# Patient Record
Sex: Female | Born: 2000 | Race: Black or African American | Hispanic: No | Marital: Single
Health system: Midwestern US, Community
[De-identification: ages and names within clinical notes are randomized; demographics above are authoritative.]

---

## 2019-01-26 ENCOUNTER — Emergency Department: Admit: 2019-01-26 | Payer: BLUE CROSS/BLUE SHIELD | Primary: Family Medicine

## 2019-01-26 ENCOUNTER — Inpatient Hospital Stay
Admit: 2019-01-26 | Discharge: 2019-01-27 | Disposition: A | Discharge status: Home / Self Care | Payer: BLUE CROSS/BLUE SHIELD | Attending: Emergency Medicine

## 2019-01-26 DIAGNOSIS — N760 Acute vaginitis: Secondary | ICD-10-CM

## 2019-01-26 LAB — CT/GC DNA
CHLAMYDIA TRACHOMATIS DNA, CTDNA: NOT DETECTED
CHLAMYDIA TRACHOMATIS DNA: NOT DETECTED
NEISSERIA GONORRHOEAE DNA, GCDNA: DETECTED — AB
NEISSERIA GONORRHOEAE DNA: DETECTED — AB

## 2019-01-26 LAB — POC URINE MICROSCOPIC

## 2019-01-26 LAB — METABOLIC PANEL, BASIC
Anion gap: 6 mmol/L (ref 5–15)
BUN: 16 mg/dl (ref 7–25)
CO2: 26 mEq/L (ref 21–32)
Calcium: 9.1 mg/dl (ref 8.5–10.1)
Chloride: 104 mEq/L (ref 98–107)
Creatinine: 0.8 mg/dl (ref 0.6–1.3)
GFR est AA: >60
GFR est non-AA: >60
Glucose: 89 mg/dl (ref 74–106)
Potassium: 3.9 mEq/L (ref 3.5–5.1)
Sodium: 136 mEq/L (ref 136–145)

## 2019-01-26 LAB — URINALYSIS W/ RFLX MICROSCOPIC
Bilirubin, Urine: NEGATIVE
Bilirubin: NEGATIVE
Glucose, Ur: NEGATIVE mg/dl
Glucose: NEGATIVE mg/dl
Ketone: NEGATIVE mg/dl
Ketones, Urine: NEGATIVE mg/dl
Nitrite, Urine: NEGATIVE
Nitrites: NEGATIVE
Protein, UA: NEGATIVE mg/dl
Protein: NEGATIVE mg/dl
Specific Gravity, UA: 1.025 (ref 1.005–1.030)
Specific gravity: 1.025 (ref 1.005–1.030)
Urobilinogen, UA, POCT: 0.2 mg/dl (ref 0.0–1.0)
Urobilinogen: 0.2 mg/dl (ref 0.0–1.0)
pH (UA): 5.5 (ref 5.0–9.0)
pH, UA: 5.5 (ref 5.0–9.0)

## 2019-01-26 LAB — CBC WITH AUTOMATED DIFF
BASOPHILS: 0.4 % (ref 0–3)
EOSINOPHILS: 2.3 % (ref 0–5)
HCT: 37.1 % (ref 37.0–50.0)
HGB: 12 gm/dl — ABNORMAL LOW (ref 13.0–17.2)
IMMATURE GRANULOCYTES: 0.3 % (ref 0.0–3.0)
LYMPHOCYTES: 16.5 % — ABNORMAL LOW (ref 28–48)
MCH: 25 pg — ABNORMAL LOW (ref 25.4–34.6)
MCHC: 32.3 gm/dl (ref 30.0–36.0)
MCV: 77.3 fL — ABNORMAL LOW (ref 80.0–98.0)
MONOCYTES: 8.3 % (ref 1–13)
MPV: 10.4 fL — ABNORMAL HIGH (ref 6.0–10.0)
NEUTROPHILS: 72.2 % — ABNORMAL HIGH (ref 34–64)
NRBC: 0 (ref 0–0)
PLATELET: 451 10*3/uL — ABNORMAL HIGH (ref 140–450)
RBC: 4.8 M/uL (ref 3.60–5.20)
RDW-SD: 36.5 (ref 36.4–46.3)
WBC: 12.8 10*3/uL — ABNORMAL HIGH (ref 4.0–11.0)

## 2019-01-26 LAB — WET PREP

## 2019-01-26 LAB — HCG URINE, QL
HCG urine, QL: NEGATIVE
Pregnancy Test(Urn): NEGATIVE

## 2019-01-26 LAB — CBC WITH AUTO DIFFERENTIAL
Basophils %: 0.4 % (ref 0–3)
Eosinophils %: 2.3 % (ref 0–5)
Hematocrit: 37.1 % (ref 37.0–50.0)
Hemoglobin: 12 gm/dl — ABNORMAL LOW (ref 13.0–17.2)
Immature Granulocytes: 0.3 % (ref 0.0–3.0)
Lymphocytes %: 16.5 % — ABNORMAL LOW (ref 28–48)
MCH: 25 pg — ABNORMAL LOW (ref 25.4–34.6)
MCHC: 32.3 gm/dl (ref 30.0–36.0)
MCV: 77.3 fL — ABNORMAL LOW (ref 80.0–98.0)
MPV: 10.4 fL — ABNORMAL HIGH (ref 6.0–10.0)
Monocytes %: 8.3 % (ref 1–13)
Neutrophils %: 72.2 % — ABNORMAL HIGH (ref 34–64)
Nucleated RBCs: 0 (ref 0–0)
Platelets: 451 10*3/uL — ABNORMAL HIGH (ref 140–450)
RBC: 4.8 M/uL (ref 3.60–5.20)
RDW-SD: 36.5 (ref 36.4–46.3)
WBC: 12.8 10*3/uL — ABNORMAL HIGH (ref 4.0–11.0)

## 2019-01-26 LAB — BASIC METABOLIC PANEL
Anion Gap: 6 mmol/L (ref 5–15)
BUN: 16 mg/dl (ref 7–25)
CO2: 26 mEq/L (ref 21–32)
Calcium: 9.1 mg/dl (ref 8.5–10.1)
Chloride: 104 mEq/L (ref 98–107)
Creatinine: 0.8 mg/dl (ref 0.6–1.3)
EGFR IF NonAfrican American: >60
GFR African American: >60
Glucose: 89 mg/dl (ref 74–106)
Potassium: 3.9 mEq/L (ref 3.5–5.1)
Sodium: 136 mEq/L (ref 136–145)

## 2019-01-26 MED ORDER — SODIUM CHLORIDE 0.9 % IJ SYRG
Freq: Once | INTRAMUSCULAR | Status: AC
Start: 2019-01-26 — End: 2019-01-26
  Administered 2019-01-26: 22:00:00 via INTRAVENOUS

## 2019-01-26 MED ORDER — ACETAMINOPHEN 325 MG TABLET
325 mg | ORAL | Status: AC
Start: 2019-01-26 — End: 2019-01-26
  Administered 2019-01-26: 21:00:00 via ORAL

## 2019-01-26 MED ORDER — IOPAMIDOL 76 % IV SOLN
37076 mg iodine /mL (76 %) | Freq: Once | INTRAVENOUS | Status: AC
Start: 2019-01-26 — End: 2019-01-26
  Administered 2019-01-26: via INTRAVENOUS

## 2019-01-26 MED FILL — ACETAMINOPHEN 325 MG TABLET: 325 mg | ORAL | Qty: 3

## 2019-01-26 MED FILL — ISOVUE-370  76 % INTRAVENOUS SOLUTION: 370 mg iodine /mL (76 %) | INTRAVENOUS | Qty: 80

## 2019-01-26 NOTE — ED Notes (Signed)
Report from John, RN.

## 2019-01-26 NOTE — ED Notes (Signed)
8:34 PM  01/26/19     Discharge instructions given to patient (name) with verbalization of understanding. Patient accompanied by family member.  Patient discharged with the following prescriptions, Flagyl and . Patient discharged to home. (destination).      Hassel Neth Purdom

## 2019-01-26 NOTE — ED Notes (Signed)
Patient c/c of abd cramping that started a week ago.  She was seen at [facility] for simliar complaints 3 days ago.  She feels like she is constipated. It feels like cramping pressure. The pain started around the time of her cycle.

## 2019-01-26 NOTE — ED Provider Notes (Signed)
ED Provider Notes by Rejeana BrockKlein, Jonavon Trieu G, NP at 01/26/19 1606                Author: Rejeana BrockKlein, Aquinnah Devin G, NP  Service: EMERGENCY  Author Type: Nurse Practitioner       Filed: 01/26/19 1951  Date of Service: 01/26/19 1606  Status: Attested Addendum          Editor: Rejeana BrockKlein, Hattie Pine G, NP (Nurse Practitioner)       Related Notes: Original Note by Rejeana BrockKlein, Leisl Spurrier G, NP (Nurse Practitioner) filed at 01/26/19  1951          Cosigner: Gwenyth AllegraNelson, Timothy S, MD at 01/26/19 2050          Attestation signed by Gwenyth AllegraNelson, Timothy S, MD at 01/26/19 2050          I interviewed and examined the patient. I discussed the patient with the advanced practice provider, Rejeana BrockLouellen G Mahesh Sizemore, NP, and I agree with the evaluation and  plan as documented here.      On my exam patient states that her pain is almost completely gone its down to 1 out of 10.  She is not been running fevers.  She reports for 5 days of episodic pain and points to the right lower quadrant just above the inguinal ligament.  She reports  being seen in [facility] department and "they did not tell me anything".  She has no rebound no guarding and really no significant abdominal tenderness at time of my exam.  Her BMI is 58.9.  There was no reported significant tenderness on pelvic  exam.  She denies running fevers.  She is afebrile here.  Urine is trace leukocytes negative nitrites.  Trace blood.  Pregnancy screen negative.  Wet prep showed clue cells.  No yeast no trichomonas.  WBC minimally elevated 12.8.  Hemoglobin hematocrit  of 12 and 37.  Platelets are normal.  Differential shows 72% neutrophils.  We ordered a CT scan of the abdomen pelvis with IV contrast.  While we are waiting for the CT, the patient's GC/chlamydia DNA probe came back positive for gonorrhea.  CT scan shows  marked inflammation within the pelvis more so on the right side.  There appears to be an involving tubular structure findings concerning for salpingitis, phlegmon or abscess or  pyosalpinx.  The patient does not have any systemic illness or fever.  Only  minimal leukocytosis.  At this point I think we can treat with IM Rocephin, oral doxycycline, and oral metronidazole.  I do not think she warrants admission for IV antibiotics but do think that she needs to follow-up closely with OB/GYN.  This is been  stressed to the patient.  She is asked us not to tell her father about the diagnosis.  I agree with the documentation as detailed below.                                       [facility]   Emergency Department Treatment Report          Patient: Margaret Meza  Age: 18 y.o.  Sex: female          Date of Birth: [birth year]  Admit Date: 01/26/2019  PCP: Sol Blazingther, Phys, MD     MRN: [id number]   CSN: [id number]   Gwenyth AllegraNELSON, TIMOTHY S, MD         Room: [id number]/[id number]  Time Dictated: 4:06 PM  Rejeana Brock, NP           Chief Complaint      Chief Complaint       Patient presents with        ?  Abdominal Pain        ?  Constipation              History of Present Illness     18 y.o. female  presents to the emergency department today for right lower quadrant abdominal pain.  Patient reports that she has had abdominal pain for approximately 1 week.  She reports that she went to the emergency department in [place] in which they did an x-ray  and a urinalysis which did not show any abnormalities.  The patient reports that she began taking mag citrate on Sunday and developed loose stools up until Monday.  She now states he has had normal bowel movement since yesterday and also had one today.   She reports that her pain comes and goes to the right lower quadrant.  Currently her pain is 3/10.  She has taken Tylenol and Motrin in the past but it reports that has not alleviated her pain.  She denies any nausea, vomiting, diarrhea, fevers, genital  discharge, or dysuria.  She reports that her last menstrual period ended on Saturday but cannot recall when it first started.  She did not have any  complications with her most recent menstrual period.        Review of Systems     Review of Systems    Constitutional: Negative for chills and fever.    HENT: Negative for congestion.     Eyes: Negative for pain.    Respiratory: Negative for cough.     Cardiovascular: Negative for chest pain.    Gastrointestinal: Positive for abdominal pain. Negative for nausea and vomiting.    Genitourinary: Negative for dysuria.    Musculoskeletal: Negative for myalgias.    Skin: Negative for rash.    Neurological: Negative for dizziness.            Past Medical/Surgical History     History reviewed. No pertinent past medical history.   History reviewed. No pertinent surgical history.        Social History          Social History          Socioeconomic History         ?  Marital status:  SINGLE              Spouse name:  Not on file         ?  Number of children:  Not on file     ?  Years of education:  Not on file     ?  Highest education level:  Not on file       Occupational History        ?  Not on file       Social Needs         ?  Financial resource strain:  Not on file        ?  Food insecurity:              Worry:  Not on file         Inability:  Not on file        ?  Transportation needs:  Medical:  Not on file         Non-medical:  Not on file       Tobacco Use         ?  Smoking status:  Never Smoker     ?  Smokeless tobacco:  Never Used       Substance and Sexual Activity         ?  Alcohol use:  Not on file     ?  Drug use:  Not Currently     ?  Sexual activity:  Not on file       Lifestyle        ?  Physical activity:              Days per week:  Not on file         Minutes per session:  Not on file         ?  Stress:  Not on file       Relationships        ?  Social connections:              Talks on phone:  Not on file         Gets together:  Not on file         Attends religious service:  Not on file         Active member of club or organization:  Not on file         Attends meetings of clubs or  organizations:  Not on file         Relationship status:  Not on file        ?  Intimate partner violence:              Fear of current or ex partner:  Not on file         Emotionally abused:  Not on file         Physically abused:  Not on file         Forced sexual activity:  Not on file        Other Topics  Concern        ?  Not on file       Social History Narrative        ?  Not on file             Family History     History reviewed. No pertinent family history.        Current Medications          None             Allergies     No Known Allergies        Physical Exam        Visit Vitals      BP  153/84     Pulse  96     Temp  99.3 ??F (37.4 ??C)     Resp  18     Ht  5\' 5"  (1.651 m)     Wt  (!) 160.6 kg (354 lb)     LMP  01/16/2019     SpO2  100%        BMI  58.91 kg/m??        Physical Exam   Exam conducted with a chaperone present.   Constitutional:        General: She is awake. She  is not in acute distress.     Appearance: She is obese. She is not toxic-appearing.   HENT:       Head: Normocephalic.   Neck:       Musculoskeletal: Normal range of motion and neck supple.   Cardiovascular :       Rate and Rhythm: Normal rate and regular rhythm.      Heart sounds: Normal heart sounds.    Pulmonary:       Effort: Pulmonary effort is normal. No respiratory distress.      Breath sounds: Normal breath sounds.   Chest:       Chest wall: No tenderness.   Abdominal :      General: Bowel sounds are normal. There is no distension.      Palpations: Abdomen is soft.      Tenderness: There is abdominal tenderness  in the right lower quadrant. There is no guarding or rebound.    Genitourinary :      Exam position: Supine.      Cervix: Discharge present. No cervical motion tenderness, lesion or erythema.      Adnexa:          Right: No tenderness.          Left: No tenderness.        Comments: There is a fishy odor during exam.  There is moderate amount of yellowish thin discharge noted.  Musculoskeletal: Normal  range of motion.     Skin:      General: Skin is warm and dry.      Capillary Refill: Capillary refill takes less than 2 seconds.      Findings: No erythema or rash.   Neurological:       General: No focal deficit present.      Mental Status: She is alert and oriented to person, place, and time.      GCS: GCS eye subscore is 4 . GCS verbal subscore is 5. GCS motor subscore is 6 .   Psychiatric :         Mood and Affect: Mood and affect normal.         Speech: Speech normal.         Behavior: Behavior normal. Behavior is cooperative.         Cognition and Memory: Memory normal.         Judgment: Judgment normal.               Impression and Management Plan     This is a 18 year old patient who presents to the emergency department today for a chief complaint of right lower quadrant abdominal pain that is been occurring for approximately  a week.  Currently the patient is not showing any acute signs symptoms of distress and does not appear toxic that would warrant further work-up, however because the patient has been complaining of right lower quadrant abdominal pain for approximately  1 week, a CT of the abdomen will be obtained to evaluate for any abnormalities is pertaining to her abdominal pain.  A chaperoned pelvic exam was completed with the findings noted above.  Lab work will be obtained to evaluate for any metabolic abnormalities  and infectious process.      DDX includes but is not limited to: UTI vs STI vs appendicitis vs ovarian cyst vs ovarian torsion        Diagnostic Studies     Lab:      Recent Results (  from the past 12 hour(s))     URINALYSIS W/ RFLX MICROSCOPIC          Collection Time: 01/26/19  3:09 PM         Result  Value  Ref Range            Color  YELLOW  YELLOW,STRAW         Appearance  CLOUDY (A)  CLEAR         Glucose  NEGATIVE  NEGATIVE,Negative mg/dl       Bilirubin  NEGATIVE  NEGATIVE,Negative         Ketone  NEGATIVE  NEGATIVE,Negative mg/dl       Specific gravity  1.025  1.005 - 1.030         Blood   TRACE (A)  NEGATIVE,Negative         pH (UA)  5.5  5.0 - 9.0         Protein  NEGATIVE  NEGATIVE,Negative mg/dl       Urobilinogen  0.2  0.0 - 1.0 mg/dl       Nitrites  NEGATIVE  NEGATIVE,Negative         Leukocyte Esterase  TRACE (A)  NEGATIVE,Negative         POC URINE MICROSCOPIC          Collection Time: 01/26/19  3:09 PM         Result  Value  Ref Range            CRYSTALS, AMORPHOUS PHOSPHATE  3+  /HPF       HCG URINE, QL          Collection Time: 01/26/19  3:09 PM         Result  Value  Ref Range            HCG urine, QL  NEGATIVE  NEGATIVE         WET PREP          Collection Time: 01/26/19  4:25 PM         Result  Value  Ref Range            Wet prep                  Clue Cells Seen   No Yeast or motile Trichomonas seen          CT/GC DNA          Collection Time: 01/26/19  4:25 PM         Result  Value  Ref Range            CHLAMYDIA TRACHOMATIS DNA  NOT DETECTED  NOT DETECTED         NEISSERIA GONORRHOEAE DNA  DETECTED (A)  NOT DETECTED         METABOLIC PANEL, BASIC          Collection Time: 01/26/19  5:00 PM         Result  Value  Ref Range            Sodium  136  136 - 145 mEq/L       Potassium  3.9  3.5 - 5.1 mEq/L       Chloride  104  98 - 107 mEq/L       CO2  26  21 - 32 mEq/L       Glucose  89  74 - 106 mg/dl  BUN  16  7 - 25 mg/dl       Creatinine  0.8  0.6 - 1.3 mg/dl       GFR est AA  >10.1          GFR est non-AA  >60          Calcium  9.1  8.5 - 10.1 mg/dl       Anion gap  6  5 - 15 mmol/L       CBC WITH AUTOMATED DIFF          Collection Time: 01/26/19  5:00 PM         Result  Value  Ref Range            WBC  12.8 (H)  4.0 - 11.0 1000/mm3       RBC  4.80  3.60 - 5.20 M/uL       HGB  12.0 (L)  13.0 - 17.2 gm/dl       HCT  75.1  02.5 - 50.0 %       MCV  77.3 (L)  80.0 - 98.0 fL       MCH  25.0 (L)  25.4 - 34.6 pg       MCHC  32.3  30.0 - 36.0 gm/dl       PLATELET  852 (H)  140 - 450 1000/mm3       MPV  10.4 (H)  6.0 - 10.0 fL       RDW-SD  36.5  36.4 - 46.3         NRBC  0  0 - 0          IMMATURE GRANULOCYTES  0.3  0.0 - 3.0 %       NEUTROPHILS  72.2 (H)  34 - 64 %       LYMPHOCYTES  16.5 (L)  28 - 48 %       MONOCYTES  8.3  1 - 13 %       EOSINOPHILS  2.3  0 - 5 %            BASOPHILS  0.4  0 - 3 %           Imaging:     Ct Abd Pelv W Cont      Result Date: 01/26/2019   INDICATION: Right lower quadrant pain COMPARISON: None available. TECHNIQUE: CT of the abdomen and pelvis WITH intravenous contrast. DICOM format image data is available to non-affiliated external healthcare facilities or entities on a secure, media free,  reciprocally searchable basis with patient authorization for 12 months following the date of the study. Findings: Liver, gallbladder, pancreas, spleen, adrenal glands and kidneys grossly unremarkable. Hyperdensity within the collecting system of the kidneys,  likely reflecting early contrast excretion. Small and large bowel grossly unremarkable. A normal appendix is identified. A few scattered retroperitoneal lymph nodes, which are not pathologically enlarged by size criteria, though increased in number. Extensive  inflammatory change is noted within the pelvis, predominantly surrounding a tubular structure within the right adnexa, which demonstrates heterogeneous enhancement. This structure appears to be separate, though situated just anterior to the right ovary.  This is concerning for infection within the right adnexa, perhaps the fallopian tube. Heterogeneous enhancement of the left ovary, which is also slightly prominent in size roughly measuring 4.6 x 3.9 cm. Visualized osseous structures grossly unremarkable.       IMPRESSION: 1. Marked inflammation within the  pelvis, more so on the right, involving a tubular structure. Findings concerning for salpingitis, phlegmon /abscess or pyosalpinx. Heterogeneous left ovary. Dedicated ultrasound is recommended.            Medical Decision Making/ED Course      The patient tested positive for gonorrhea.  All other lab work was otherwise  unremarkable.  White blood  cell count is within normal limits.  CT of the exam shows is inflammation within the pelvis on the right tubular structure which is concerning for salpingitis abscess or pyosalpinx.  However, the patient currently is not showing any systemic signs and  symptoms such as fever, hypotension, tachycardia that would be concerning for infection at this time.  Findings were discussed with the patient who verbalized understanding.  It was discussed with the patient that she should abstain from sexual activity  until she is retested again for cure.  The patient verbalized understanding and receiving IM Rocephin and doing oral antibiotics of Flagyl and doxycycline for 2 weeks.  I also recommended that she follow-up with the [facility] department for further  STI work-up.  I highly encouraged the patient to follow-up with her primary care doctor and also with an OB/GYN in regards to her condition.  Signs and symptoms on when to return to the emergency department were discussed.  Patient verbalized understanding  of all instructions and was amenable to discharge.        Final Diagnosis                 ICD-10-CM  ICD-9-CM          1.  Abdominal pain, right lower quadrant  R10.31  789.03     2.  BV (bacterial vaginosis)  N76.0  616.10           B96.89  041.9          3.  Gonorrhea  A54.9  098.0             Disposition     Discharge      Dalbert Mayotte, FNP-BC   January 26, 2019    4:06 PM       The patient was personally evaluated by myself and with Dr. Delton See, Lavone Neri, MD who agrees with the above assessment and plan.      Dragon medical dictation software was used for portions of this report. Unintended errors may occur.       My signature above authenticates this document and my orders, the final diagnosis(es), discharge prescription(s), and instructions in the Epic record.      If you have any questions please contact [phone].       Nursing notes have been reviewed by the  physician/advanced practice clinician.

## 2019-01-26 NOTE — ED Notes (Signed)
Started last week with intermittent LLQ abd pain  Denies any other symptoms

## 2019-01-26 NOTE — ED Notes (Signed)
Bedside shift change report given to Huntley Dec RN (oncoming nurse) by Alberteen Spindle (offgoing nurse). Report included the following information ED Summary.

## 2019-01-26 NOTE — ED Triage Notes (Signed)
Patient c/c of abd cramping that started a week ago.  She was seen at [facility] for simliar complaints 3 days ago.  She feels like she is constipated. It feels like cramping pressure. The pain started around the time of her cycle.

## 2019-01-26 NOTE — ED Notes (Signed)
Started last week with intermittent LLQ abd pain  Denies any other symptoms

## 2019-01-26 NOTE — ED Provider Notes (Addendum)
[facility] Care  Emergency Department Treatment Report    Patient: Margaret Meza Age: 18 y.o. Sex: female    Date of Birth: [birth year] Admit Date: 01/26/2019 PCP: Sol Blazing, MD   MRN: [id number]  CSN: [id number]  Gwenyth Allegra, MD   Room: [id number] Time Dictated: 4:06 PM Rejeana Brock, NP       Chief Complaint   Chief Complaint   Patient presents with   ??? Abdominal Pain   ??? Constipation        History of Present Illness   18 y.o. female presents to the emergency department today for right lower quadrant abdominal pain.  Patient reports that she has had abdominal pain for approximately 1 week.  She reports that she went to the emergency department in [place] in which they did an x-ray and a urinalysis which did not show any abnormalities.  The patient reports that she began taking mag citrate on Sunday and developed loose stools up until Monday.  She now states he has had normal bowel movement since yesterday and also had one today.  She reports that her pain comes and goes to the right lower quadrant.  Currently her pain is 3/10.  She has taken Tylenol and Motrin in the past but it reports that has not alleviated her pain.  She denies any nausea, vomiting, diarrhea, fevers, genital discharge, or dysuria.  She reports that her last menstrual period ended on Saturday but cannot recall when it first started.  She did not have any complications with her most recent menstrual period.    Review of Systems   Review of Systems   Constitutional: Negative for chills and fever.   HENT: Negative for congestion.    Eyes: Negative for pain.   Respiratory: Negative for cough.    Cardiovascular: Negative for chest pain.   Gastrointestinal: Positive for abdominal pain. Negative for nausea and vomiting.   Genitourinary: Negative for dysuria.   Musculoskeletal: Negative for myalgias.   Skin: Negative for rash.   Neurological: Negative for dizziness.       Past Medical/Surgical History    History reviewed. No pertinent past medical history.  History reviewed. No pertinent surgical history.    Social History     Social History     Socioeconomic History   ??? Marital status: SINGLE     Spouse name: Not on file   ??? Number of children: Not on file   ??? Years of education: Not on file   ??? Highest education level: Not on file   Occupational History   ??? Not on file   Social Needs   ??? Financial resource strain: Not on file   ??? Food insecurity:     Worry: Not on file     Inability: Not on file   ??? Transportation needs:     Medical: Not on file     Non-medical: Not on file   Tobacco Use   ??? Smoking status: Never Smoker   ??? Smokeless tobacco: Never Used   Substance and Sexual Activity   ??? Alcohol use: Not on file   ??? Drug use: Not Currently   ??? Sexual activity: Not on file   Lifestyle   ??? Physical activity:     Days per week: Not on file     Minutes per session: Not on file   ??? Stress: Not on file   Relationships   ??? Social connections:     Talks on phone:  Not on file     Gets together: Not on file     Attends religious service: Not on file     Active member of club or organization: Not on file     Attends meetings of clubs or organizations: Not on file     Relationship status: Not on file   ??? Intimate partner violence:     Fear of current or ex partner: Not on file     Emotionally abused: Not on file     Physically abused: Not on file     Forced sexual activity: Not on file   Other Topics Concern   ??? Not on file   Social History Narrative   ??? Not on file       Family History   History reviewed. No pertinent family history.    Current Medications     None       Allergies   No Known Allergies    Physical Exam     Visit Vitals  BP 153/84   Pulse 96   Temp 99.3 ??F (37.4 ??C)   Resp 18   Ht 5\' 5"  (1.651 m)   Wt (!) 160.6 kg (354 lb)   LMP 01/16/2019   SpO2 100%   BMI 58.91 kg/m??     Physical Exam  Exam conducted with a chaperone present.   Constitutional:       General: She is awake. She is not in acute distress.      Appearance: She is obese. She is not toxic-appearing.   HENT:      Head: Normocephalic.   Neck:      Musculoskeletal: Normal range of motion and neck supple.   Cardiovascular:      Rate and Rhythm: Normal rate and regular rhythm.      Heart sounds: Normal heart sounds.   Pulmonary:      Effort: Pulmonary effort is normal. No respiratory distress.      Breath sounds: Normal breath sounds.   Chest:      Chest wall: No tenderness.   Abdominal:      General: Bowel sounds are normal. There is no distension.      Palpations: Abdomen is soft.      Tenderness: There is abdominal tenderness in the right lower quadrant. There is no guarding or rebound.   Genitourinary:     Exam position: Supine.      Cervix: Discharge present. No cervical motion tenderness, lesion or erythema.      Adnexa:         Right: No tenderness.          Left: No tenderness.        Comments: There is a fishy odor during exam.  There is moderate amount of yellowish thin discharge noted.  Musculoskeletal: Normal range of motion.   Skin:     General: Skin is warm and dry.      Capillary Refill: Capillary refill takes less than 2 seconds.      Findings: No erythema or rash.   Neurological:      General: No focal deficit present.      Mental Status: She is alert and oriented to person, place, and time.      GCS: GCS eye subscore is 4. GCS verbal subscore is 5. GCS motor subscore is 6.   Psychiatric:         Mood and Affect: Mood and affect normal.  Speech: Speech normal.         Behavior: Behavior normal. Behavior is cooperative.         Cognition and Memory: Memory normal.         Judgment: Judgment normal.         Impression and Management Plan   This is a 18 year old patient who presents to the emergency department today for a chief complaint of right lower quadrant abdominal pain that is been occurring for approximately a week.  Currently the patient is not showing any acute signs symptoms of distress and does not appear toxic  that would warrant further work-up, however because the patient has been complaining of right lower quadrant abdominal pain for approximately 1 week, a CT of the abdomen will be obtained to evaluate for any abnormalities is pertaining to her abdominal pain.  A chaperoned pelvic exam was completed with the findings noted above.  Lab work will be obtained to evaluate for any metabolic abnormalities and infectious process.    DDX includes but is not limited to: UTI vs STI vs appendicitis vs ovarian cyst vs ovarian torsion    Diagnostic Studies   Lab:   Recent Results (from the past 12 hour(s))   URINALYSIS W/ RFLX MICROSCOPIC    Collection Time: 01/26/19  3:09 PM   Result Value Ref Range    Color YELLOW YELLOW,STRAW      Appearance CLOUDY (A) CLEAR      Glucose NEGATIVE NEGATIVE,Negative mg/dl    Bilirubin NEGATIVE NEGATIVE,Negative      Ketone NEGATIVE NEGATIVE,Negative mg/dl    Specific gravity 1.6101.025 1.005 - 1.030      Blood TRACE (A) NEGATIVE,Negative      pH (UA) 5.5 5.0 - 9.0      Protein NEGATIVE NEGATIVE,Negative mg/dl    Urobilinogen 0.2 0.0 - 1.0 mg/dl    Nitrites NEGATIVE NEGATIVE,Negative      Leukocyte Esterase TRACE (A) NEGATIVE,Negative     POC URINE MICROSCOPIC    Collection Time: 01/26/19  3:09 PM   Result Value Ref Range    CRYSTALS, AMORPHOUS PHOSPHATE 3+ /HPF   HCG URINE, QL    Collection Time: 01/26/19  3:09 PM   Result Value Ref Range    HCG urine, QL NEGATIVE NEGATIVE     WET PREP    Collection Time: 01/26/19  4:25 PM   Result Value Ref Range    Wet prep        Clue Cells Seen  No Yeast or motile Trichomonas seen     CT/GC DNA    Collection Time: 01/26/19  4:25 PM   Result Value Ref Range    CHLAMYDIA TRACHOMATIS DNA NOT DETECTED NOT DETECTED      NEISSERIA GONORRHOEAE DNA DETECTED (A) NOT DETECTED     METABOLIC PANEL, BASIC    Collection Time: 01/26/19  5:00 PM   Result Value Ref Range    Sodium 136 136 - 145 mEq/L    Potassium 3.9 3.5 - 5.1 mEq/L    Chloride 104 98 - 107 mEq/L     CO2 26 21 - 32 mEq/L    Glucose 89 74 - 106 mg/dl    BUN 16 7 - 25 mg/dl    Creatinine 0.8 0.6 - 1.3 mg/dl    GFR est AA >96.0>60.0      GFR est non-AA >60      Calcium 9.1 8.5 - 10.1 mg/dl    Anion gap 6 5 - 15 mmol/L  CBC WITH AUTOMATED DIFF    Collection Time: 01/26/19  5:00 PM   Result Value Ref Range    WBC 12.8 (H) 4.0 - 11.0 1000/mm3    RBC 4.80 3.60 - 5.20 M/uL    HGB 12.0 (L) 13.0 - 17.2 gm/dl    HCT 96.0 45.4 - 09.8 %    MCV 77.3 (L) 80.0 - 98.0 fL    MCH 25.0 (L) 25.4 - 34.6 pg    MCHC 32.3 30.0 - 36.0 gm/dl    PLATELET 119 (H) 147 - 450 1000/mm3    MPV 10.4 (H) 6.0 - 10.0 fL    RDW-SD 36.5 36.4 - 46.3      NRBC 0 0 - 0      IMMATURE GRANULOCYTES 0.3 0.0 - 3.0 %    NEUTROPHILS 72.2 (H) 34 - 64 %    LYMPHOCYTES 16.5 (L) 28 - 48 %    MONOCYTES 8.3 1 - 13 %    EOSINOPHILS 2.3 0 - 5 %    BASOPHILS 0.4 0 - 3 %       Imaging:    Ct Abd Pelv W Cont    Result Date: 01/26/2019  INDICATION: Right lower quadrant pain COMPARISON: None available. TECHNIQUE: CT of the abdomen and pelvis WITH intravenous contrast. DICOM format image data is available to non-affiliated external healthcare facilities or entities on a secure, media free, reciprocally searchable basis with patient authorization for 12 months following the date of the study. Findings: Liver, gallbladder, pancreas, spleen, adrenal glands and kidneys grossly unremarkable. Hyperdensity within the collecting system of the kidneys, likely reflecting early contrast excretion. Small and large bowel grossly unremarkable. A normal appendix is identified. A few scattered retroperitoneal lymph nodes, which are not pathologically enlarged by size criteria, though increased in number. Extensive inflammatory change is noted within the pelvis, predominantly surrounding a tubular structure within the right adnexa, which demonstrates heterogeneous enhancement. This structure appears to be separate, though situated just anterior to the right ovary. This is concerning for  infection within the right adnexa, perhaps the fallopian tube. Heterogeneous enhancement of the left ovary, which is also slightly prominent in size roughly measuring 4.6 x 3.9 cm. Visualized osseous structures grossly unremarkable.     IMPRESSION: 1. Marked inflammation within the pelvis, more so on the right, involving a tubular structure. Findings concerning for salpingitis, phlegmon /abscess or pyosalpinx. Heterogeneous left ovary. Dedicated ultrasound is recommended.       Medical Decision Making/ED Course    The patient tested positive for gonorrhea.  All other lab work was otherwise unremarkable.  White blood cell count is within normal limits.  CT of the exam shows is inflammation within the pelvis on the right tubular structure which is concerning for salpingitis abscess or pyosalpinx.  However, the patient currently is not showing any systemic signs and symptoms such as fever, hypotension, tachycardia that would be concerning for infection at this time.  Findings were discussed with the patient who verbalized understanding.  It was discussed with the patient that she should abstain from sexual activity until she is retested again for cure.  The patient verbalized understanding and receiving IM Rocephin and doing oral antibiotics of Flagyl and doxycycline for 2 weeks.  I also recommended that she follow-up with the [facility] department for further STI work-up.  I highly encouraged the patient to follow-up with her primary care doctor and also with an OB/GYN in regards to her condition.  Signs and symptoms on when  to return to the emergency department were discussed.  Patient verbalized understanding of all instructions and was amenable to discharge.    Final Diagnosis       ICD-10-CM ICD-9-CM   1. Abdominal pain, right lower quadrant R10.31 789.03   2. BV (bacterial vaginosis) N76.0 616.10    B96.89 041.9   3. Gonorrhea A54.9 098.0       Disposition   Discharge    Dalbert Mayotte, FNP-BC   January 26, 2019   4:06 PM     The patient was personally evaluated by myself and with Dr. Delton See, Lavone Neri, MD who agrees with the above assessment and plan.    Dragon medical dictation software was used for portions of this report. Unintended errors may occur.     My signature above authenticates this document and my orders, the final diagnosis(es), discharge prescription(s), and instructions in the Epic record.    If you have any questions please contact [phone].     Nursing notes have been reviewed by the physician/advanced practice clinician.

## 2019-01-26 NOTE — ED Notes (Signed)
Bedside shift change report given to Sara RN  (oncoming nurse) by Hayla Hinger RN (offgoing nurse). Report included the following information ED Summary.

## 2019-01-26 NOTE — ED Notes (Signed)
8:34 PM  01/26/19     Discharge instructions given to patient (name) with verbalization of understanding. Patient accompanied by family member.  Patient discharged with the following prescriptions, Flagyl and . Patient discharged to home. (destination).      Margaret Meza

## 2019-01-26 NOTE — ED Notes (Signed)
Report from John,RN.

## 2019-01-27 MED ORDER — DOXYCYCLINE HYCLATE 100 MG TAB
100 mg | ORAL_TABLET | Freq: Two times a day (BID) | ORAL | 0 refills | Status: AC
Start: 2019-01-27 — End: 2019-02-09

## 2019-01-27 MED ORDER — CEFTRIAXONE 250 MG SOLUTION FOR INJECTION
250 mg | Freq: Once | INTRAMUSCULAR | Status: AC
Start: 2019-01-27 — End: 2019-01-26
  Administered 2019-01-27: 01:00:00 via INTRAMUSCULAR

## 2019-01-27 MED ORDER — METRONIDAZOLE 500 MG TAB
500 mg | ORAL_TABLET | Freq: Two times a day (BID) | ORAL | 0 refills | Status: AC
Start: 2019-01-27 — End: 2019-02-09

## 2019-01-27 MED FILL — CEFTRIAXONE 250 MG SOLUTION FOR INJECTION: 250 mg | INTRAMUSCULAR | Qty: 250

## 2019-12-11 ENCOUNTER — Emergency Department (HOSPITAL_COMMUNITY): Payer: BC Managed Care – PPO

## 2019-12-11 ENCOUNTER — Other Ambulatory Visit: Payer: Self-pay

## 2019-12-11 ENCOUNTER — Encounter (HOSPITAL_COMMUNITY): Payer: Self-pay

## 2019-12-11 ENCOUNTER — Emergency Department (HOSPITAL_COMMUNITY)
Admission: EM | Admit: 2019-12-11 | Discharge: 2019-12-11 | Disposition: A | Discharge status: Home / Self Care | Payer: BC Managed Care – PPO | Attending: Emergency Medicine | Admitting: Emergency Medicine

## 2019-12-11 DIAGNOSIS — R0789 Other chest pain: Secondary | ICD-10-CM | POA: Diagnosis not present

## 2019-12-11 DIAGNOSIS — M25511 Pain in right shoulder: Secondary | ICD-10-CM | POA: Diagnosis present

## 2019-12-11 MED ORDER — NAPROXEN 500 MG PO TABS: 500.0000 mg | ORAL_TABLET | Freq: Two times a day (BID) | ORAL | 0 refills | Status: AC

## 2019-12-11 MED ORDER — METHOCARBAMOL 500 MG PO TABS: 500.0000 mg | ORAL_TABLET | Freq: Three times a day (TID) | ORAL | 0 refills | Status: AC | PRN

## 2019-12-11 NOTE — Discharge Instructions (Addendum)
You were seen in the ER today for right shoulder tingling/discomfort and right sided rib pain. Your xrays did not show any abnormalities. We suspect your symptoms may be due to muscular irritation. WE are sending you home with the following medicines:   - Naproxen is a nonsteroidal anti-inflammatory medication that will help with pain and swelling. Be sure to take this medication as prescribed with food, 1 pill every 12 hours,  It should be taken with food, as it can cause stomach upset, and more seriously, stomach bleeding. Do not take other nonsteroidal anti-inflammatory medications with this such as Advil, Motrin, Aleve, Mobic, Goodie Powder, or Motrin.    - Robaxin is the muscle relaxer I have prescribed, this is meant to help with muscle tightness. Be aware that this medication may make you drowsy therefore the first time you take this it should be at a time you are in an environment where you can rest. Do not drive or operate heavy machinery when taking this medication. Do not drink alcohol or take other sedating medications with this medicine such as narcotics or benzodiazepines.   You make take Tylenol per over the counter dosing with these medications.   We have prescribed you new medication(s) today. Discuss the medications prescribed today with your pharmacist as they can have adverse effects and interactions with your other medicines including over the counter and prescribed medications. Seek medical evaluation if you start to experience new or abnormal symptoms after taking one of these medicines, seek care immediately if you start to experience difficulty breathing, feeling of your throat closing, facial swelling, or rash as these could be indications of a more serious allergic reaction  Please follow up with primary care within 1 week.  Return to the ER for new or worsening symptoms including but not limited to increased pain, trouble breathing, coughing up blood, fever, increase tingling,  numbness, weakness, or any other concerns.

## 2019-12-11 NOTE — ED Provider Notes (Signed)
[facility]-EMERGENCY DEPT Provider Note   CSN: [id number] Arrival date & time: 12/11/19  1038     History Chief Complaint  Patient presents with  . Shoulder Pain  . rib cage pain    Teresa Parsons is a 18 y.o. female without significant past medical hx who presents to the ED with complaints of R sided rib/shoulder pain x 1 week. Patient states pain is primarily to the R lateral ribs, also having some discomfort/pressure/tingling to the R shoulder. She states sxs occur intermittently, specifically with heavy lifting/overhead motions of the RUE. Has had temporary relief with ibuprofen at times. She has no specific traumatic injury, but does a lot of heavy lifting as she works for an [employer] and is right hand dominant. Denies numbness, weakness, other areas of tingling, neck pain, dyspnea, chest pain, abdominal pain, nausea, vomiting,  leg pain/swelling, hemoptysis, recent surgery/trauma, recent long travel, hormone use, personal hx of cancer, or hx of DVT/PE.      HPI     History reviewed. No pertinent past medical history.  There are no problems to display for this patient.   History reviewed. No pertinent surgical history.   OB History   No obstetric history on file.     No family history on file.  Social History   Tobacco Use  . Smoking status: Not on file  Substance Use Topics  . Alcohol use: Not on file  . Drug use: Not on file    Home Medications Prior to Admission medications   Not on File    Allergies    Patient has no known allergies.  Review of Systems   Review of Systems  Constitutional: Negative for chills, diaphoresis and fever.  Respiratory: Negative for cough and shortness of breath.   Cardiovascular: Negative for chest pain, palpitations and leg swelling.  Gastrointestinal: Negative for abdominal pain, nausea and vomiting.  Musculoskeletal: Positive for arthralgias. Negative for back pain and neck pain.       +  for R lateral rib pain.   Neurological: Negative for weakness and numbness.       + for R shoulder tingling at times.     Physical Exam Updated Vital Signs BP (!) 167/98 (BP Location: Left Arm)   Pulse 94   Temp 98.7 F (37.1 C) (Oral)   Resp 16   Ht 5\' 5"  (1.651 m)   Wt (!) 168.3 kg   LMP 12/04/2019 (Approximate)   SpO2 100%   BMI 61.74 kg/m   Physical Exam Vitals and nursing note reviewed.  Constitutional:      General: She is not in acute distress.    Appearance: Normal appearance. She is well-developed. She is not ill-appearing or toxic-appearing.  HENT:     Head: Normocephalic and atraumatic.  Eyes:     General:        Right eye: No discharge.        Left eye: No discharge.     Conjunctiva/sclera: Conjunctivae normal.  Neck:     Comments: No midline tenderness.  Cardiovascular:     Rate and Rhythm: Normal rate and regular rhythm.     Pulses:          Radial pulses are 2+ on the right side and 2+ on the left side.  Pulmonary:     Effort: Pulmonary effort is normal. No respiratory distress.     Breath sounds: Normal breath sounds. No wheezing, rhonchi or rales.  Chest:  Chest wall: Tenderness (right lateral chest wall which reproduces patient's pain) present.  Abdominal:     General: There is no distension.     Palpations: Abdomen is soft.     Tenderness: There is no abdominal tenderness.  Musculoskeletal:     Cervical back: Normal range of motion and neck supple.     Comments: Upper extremities: No obvious deformity, appreciable swelling, edema, erythema, ecchymosis, warmth, or open wounds. Patient has intact AROM throughout. No point/focal bony tenderness.   Skin:    General: Skin is warm and dry.     Capillary Refill: Capillary refill takes less than 2 seconds.     Findings: No rash.  Neurological:     Mental Status: She is alert.     Comments: Alert. Clear speech. Sensation grossly intact to bilateral upper extremities. 5/5 symmetric grip strength &  strength with wrist/elbow/shoulder flexion/extension. Ambulatory.   Psychiatric:        Mood and Affect: Mood normal.        Behavior: Behavior normal.    ED Results / Procedures / Treatments   Labs (all labs ordered are listed, but only abnormal results are displayed) Labs Reviewed - No data to display  EKG None  Radiology DG Ribs Unilateral W/Chest Right  Result Date: 12/11/2019 CLINICAL DATA:  Pt states he has been lifting heavy boxes with her job, noticed some pain to her superior right shoulder x 1 week ago, as well as pain to right lateral/anterior lower ribs during inspiration. Denies any known trauma or previous injuries. EXAM: RIGHT RIBS AND CHEST - 3+ VIEW COMPARISON:  None. FINDINGS: No fracture or other bone lesions are seen involving the ribs. There is no evidence of pneumothorax or pleural effusion. Both lungs are clear. Heart size and mediastinal contours are within normal limits. IMPRESSION: Negative. Electronically Signed   By: Amie Portlandavid  Ormond M.D.   On: 12/11/2019 11:46   DG Shoulder Right  Result Date: 12/11/2019 CLINICAL DATA:  Pt states he has been lifting heavy boxes with her job, noticed some pain to her superior right shoulder x 1 week ago, as well as pain to right lateral/anterior lower ribs during inspiration. Denies any known trauma or previous injuries. EXAM: RIGHT SHOULDER - 2+ VIEW COMPARISON:  None. FINDINGS: There is no evidence of fracture or dislocation. There is no evidence of arthropathy or other focal bone abnormality. Soft tissues are unremarkable. IMPRESSION: Negative. Electronically Signed   By: Amie Portlandavid  Ormond M.D.   On: 12/11/2019 11:46    Procedures Procedures (including critical care time)  Medications Ordered in ED Medications - No data to display  ED Course/MDM:  I have reviewed the triage vital signs and the nursing notes.  Pertinent labs & imaging results that were available during my care of the patient were reviewed by me and considered  in my medical decision making (see chart for details).   Patient presents to the ED with complaints of R shoulder/rib cage pain and some intermittent tingling sensation localized to the R shoulder with heavy lifting. Patient is nontoxic appearing, in no apparent distress, vitals WNL with the exception of elevated BP- doubt HTN emergency. No rashes/erythema/warm or ROM limitation, not consistent with shingles, septic joint, or cellulitis. Xrays w/o fracture/dislocation, infiltrate or PTX. Patient is NVI distally without focal neuro deficits. PERC negative, doubt PE. Overall suspect muscular etiology. Tx with naproxen & robaxin with short term lifting restrictions. PCP follow up. I discussed results, treatment plan, need for follow-up, and return precautions  with the patient. Provided opportunity for questions, patient confirmed understanding and is in agreement with plan.    Final Clinical Impression(s) / ED Diagnoses Final diagnoses:  Right-sided chest wall pain    Rx / DC Orders ED Discharge Orders         Ordered    naproxen (NAPROSYN) 500 MG tablet  2 times daily     12/11/19 1201    methocarbamol (ROBAXIN) 500 MG tablet  Every 8 hours PRN     12/11/19 1201           Dajanique Robley, [place], PA-C 12/11/19 1202    Davonna Belling, MD 12/11/19 1457

## 2019-12-11 NOTE — ED Triage Notes (Signed)
Patient c/o right rib cage area pain and right shoulder pain and states,"It's my muscles." x 7 days. 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

## 2019-12-16 NOTE — ED Notes (Signed)
Patient needed excuse note printed again

## 2020-05-21 IMAGING — CR DG RIBS W/ CHEST 3+V*R*
5 series · 5 of 5 positions shown · non-contrast
Comparison: None.

CLINICAL DATA: Pt states he has been lifting heavy boxes with her
job, noticed some pain to her superior right shoulder x 1 week ago,
as well as pain to right lateral/anterior lower ribs during
inspiration. Denies any known trauma or previous injuries.

EXAM:
RIGHT RIBS AND CHEST - 3+ VIEW

[w chest pa]
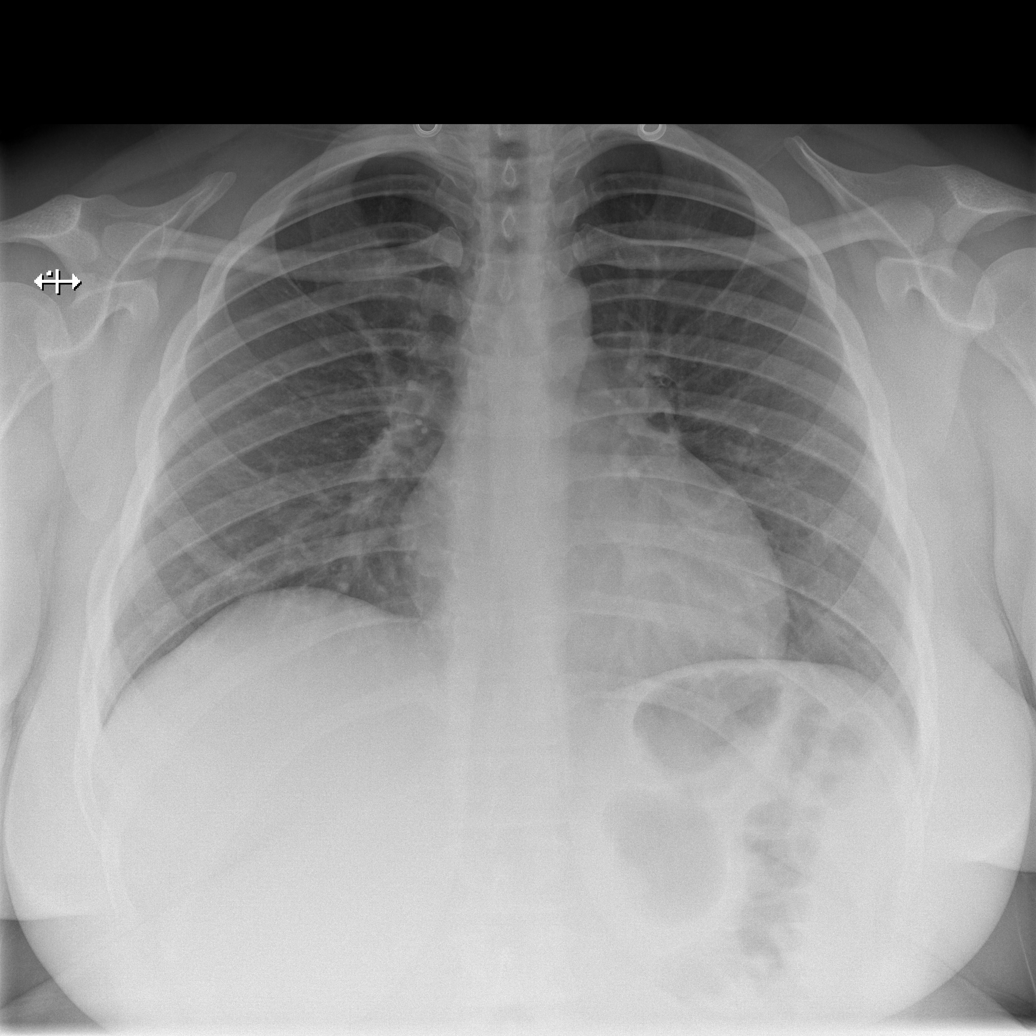

[w ribs ap upper right]
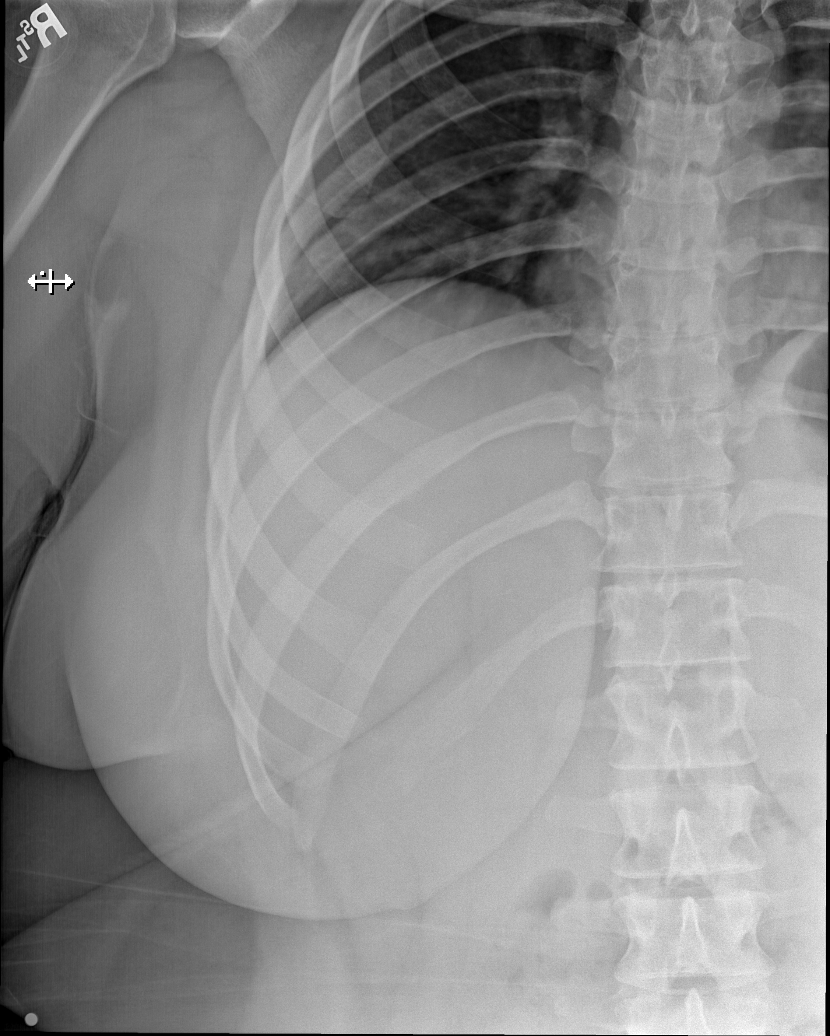

[w ribs ap lower right]
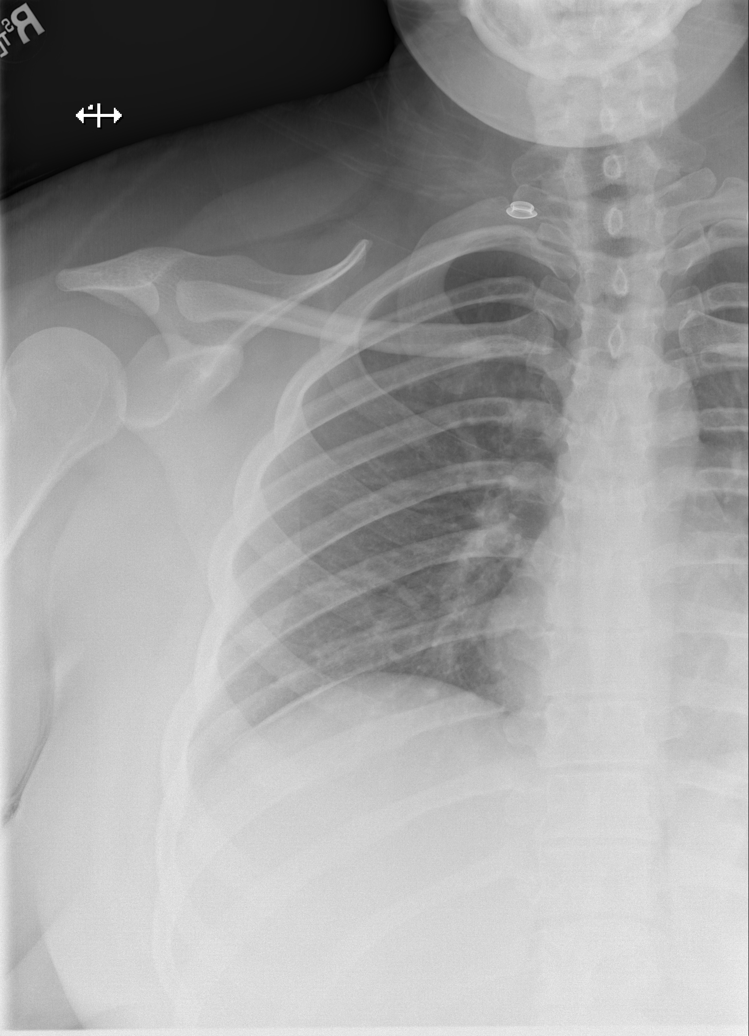

[w ribs obl right (1 of 2)]
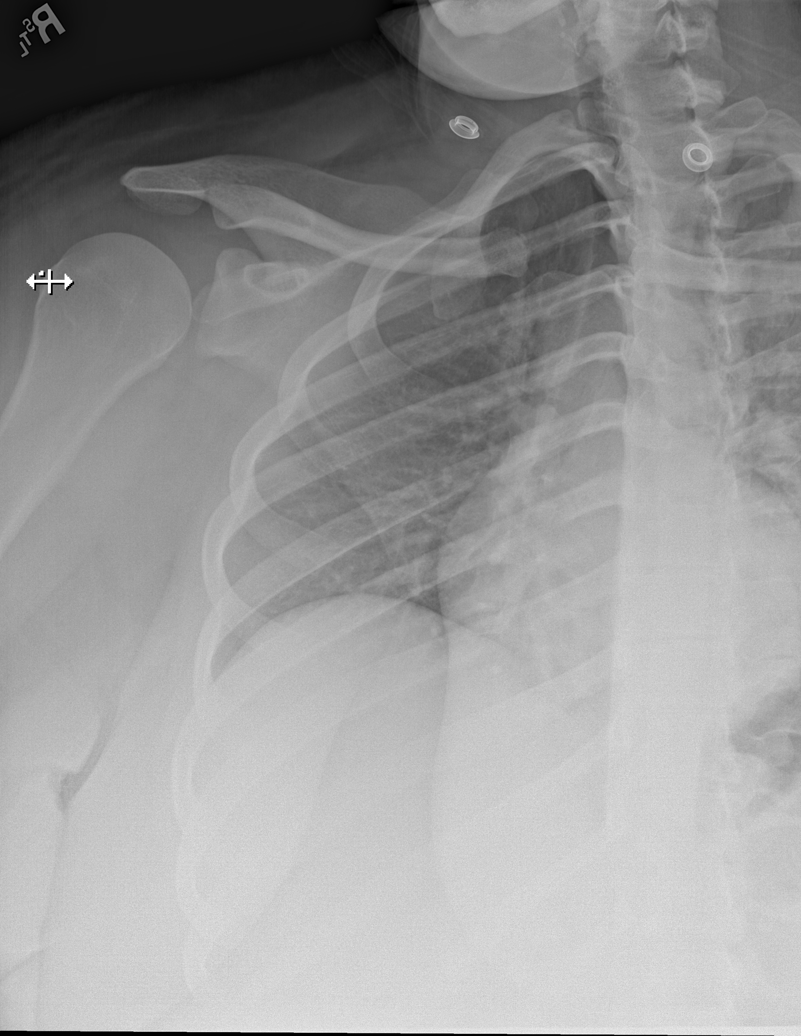

[w ribs obl right (2 of 2)]
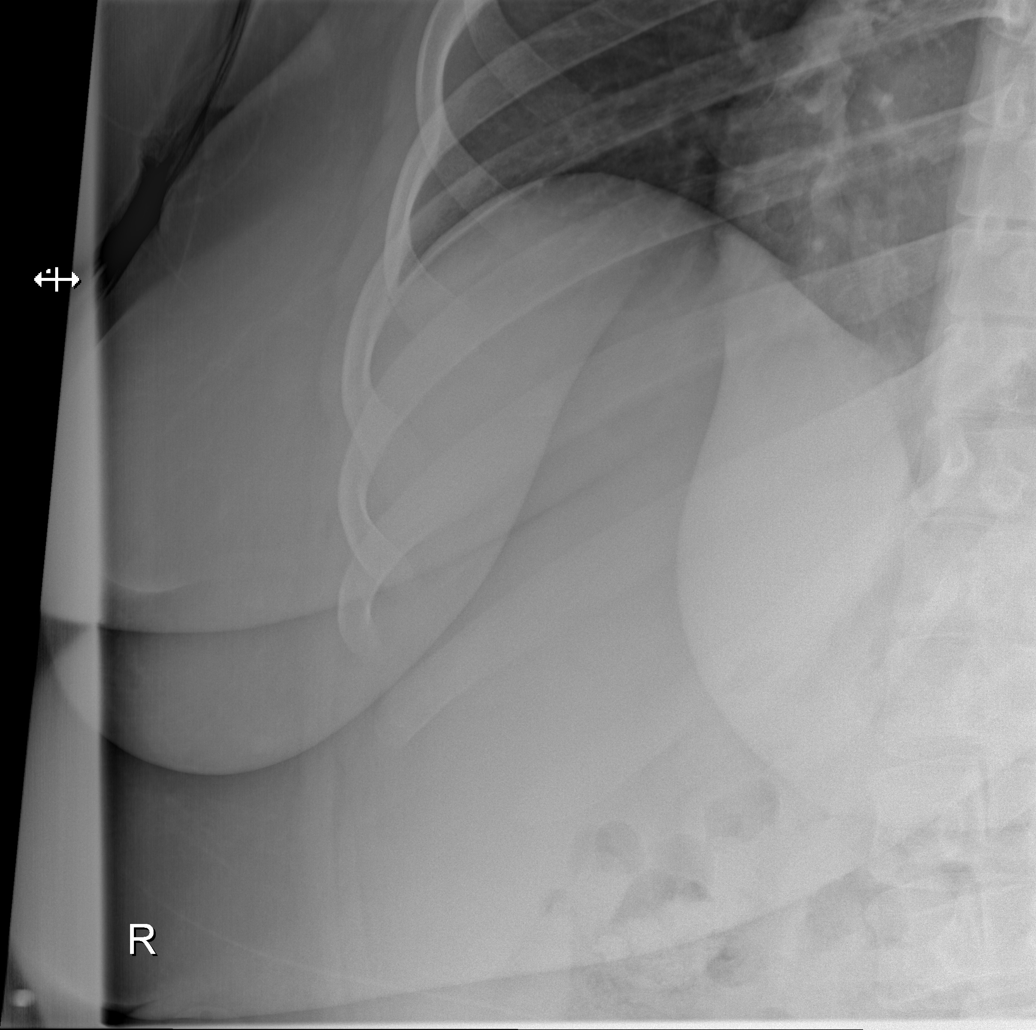

[5 of 5 positions shown; findings below may reference images not displayed]

FINDINGS: No fracture or other bone lesions are seen involving the ribs. There
is no evidence of pneumothorax or pleural effusion. Both lungs are
clear. Heart size and mediastinal contours are within normal limits.
IMPRESSION: Negative.

## 2020-05-21 IMAGING — CR DG SHOULDER 2+V*R*
3 series · 3 of 3 positions shown · non-contrast
Comparison: None.

CLINICAL DATA: Pt states he has been lifting heavy boxes with her
job, noticed some pain to her superior right shoulder x 1 week ago,
as well as pain to right lateral/anterior lower ribs during
inspiration. Denies any known trauma or previous injuries.

EXAM:
RIGHT SHOULDER - 2+ VIEW

[w shoulder external right]
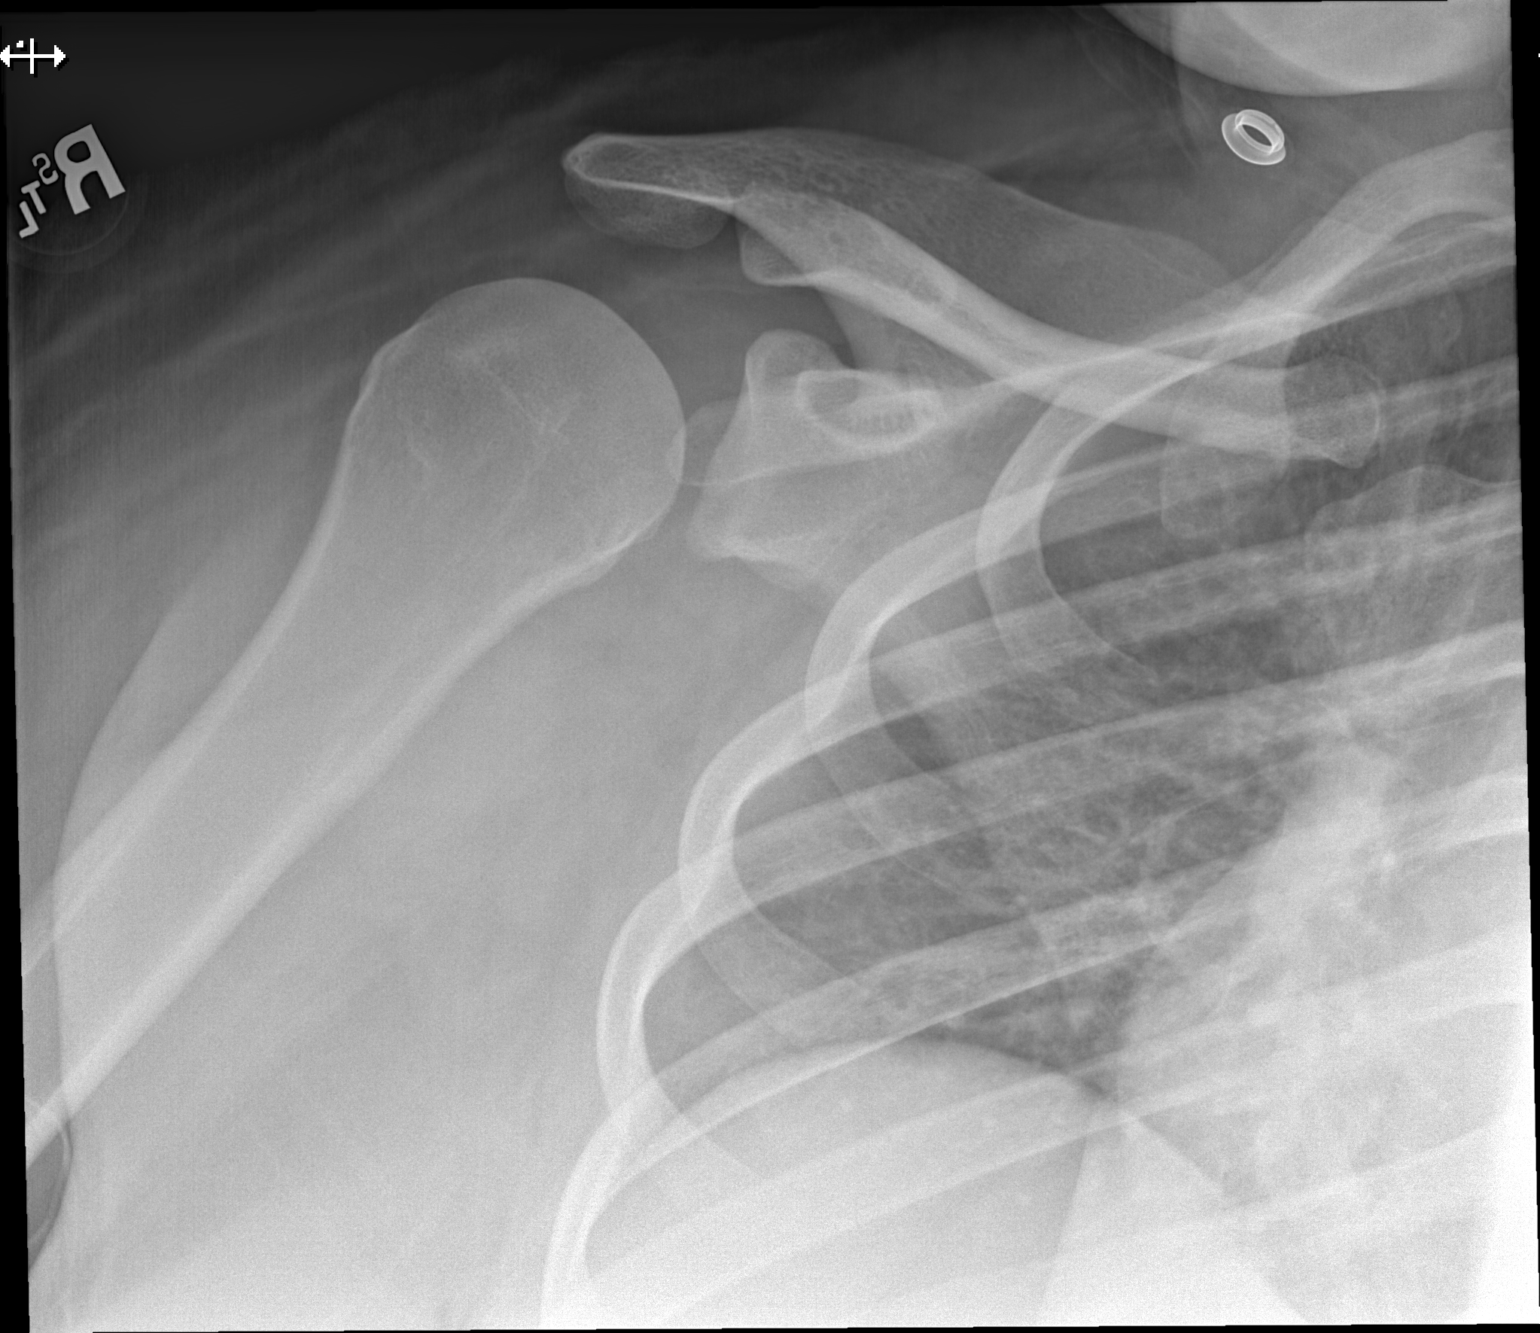

[w shoulder y-view right]
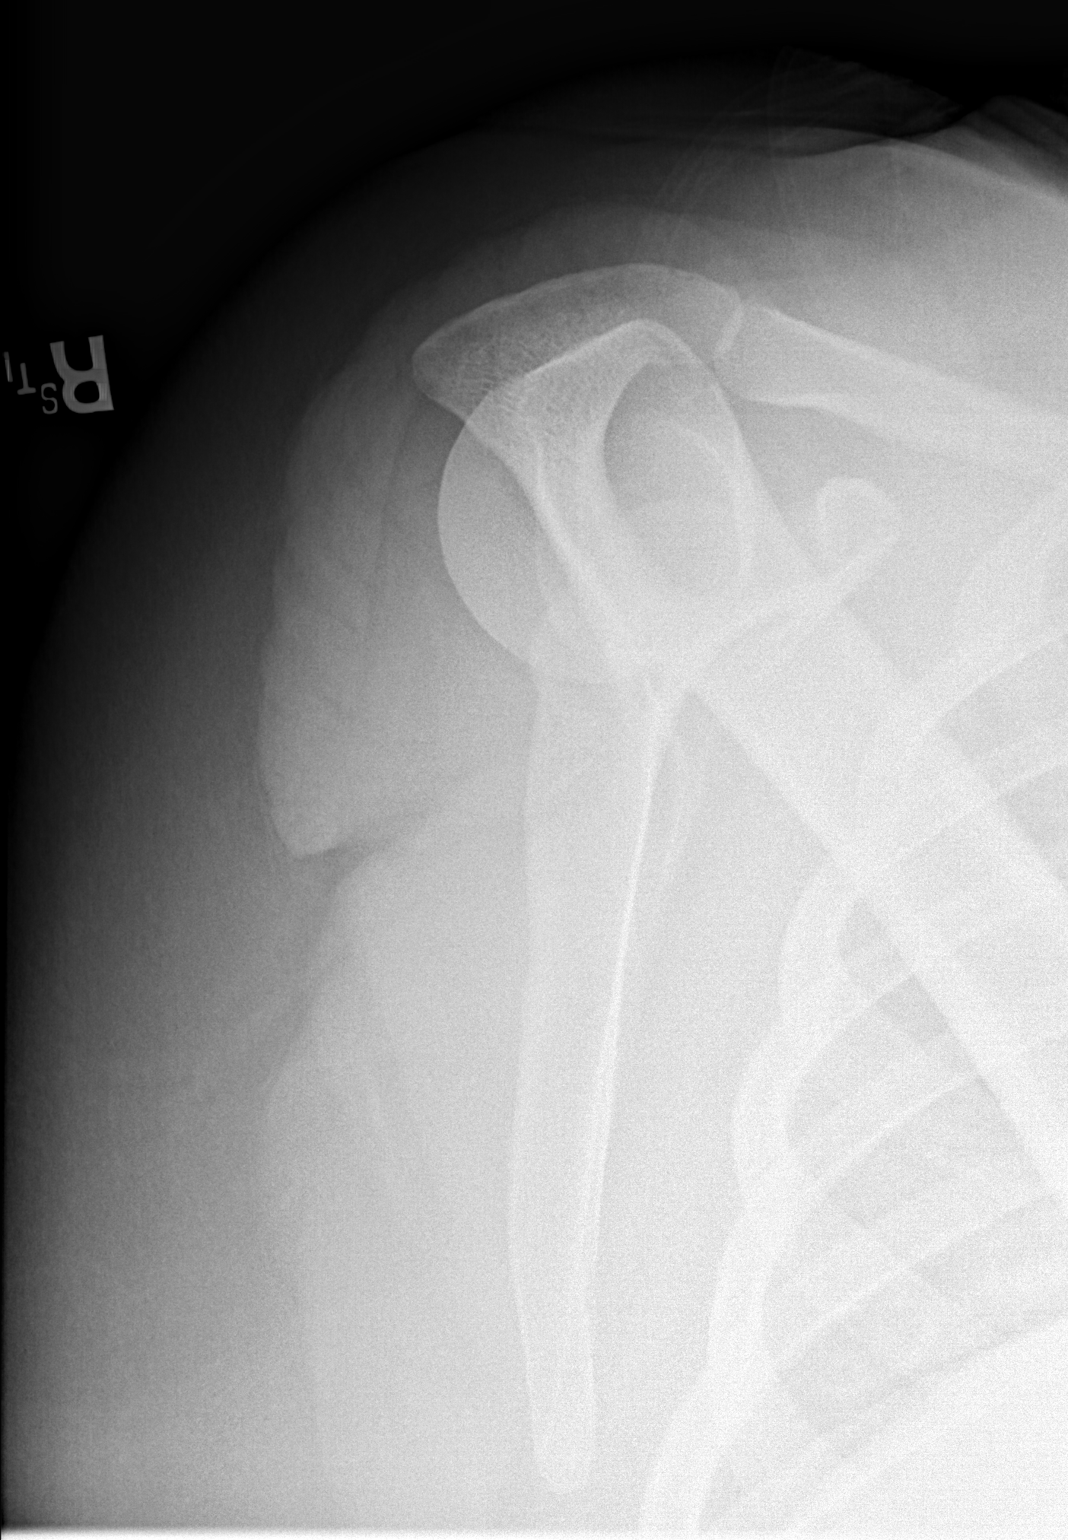

[x shoulder axillary right]
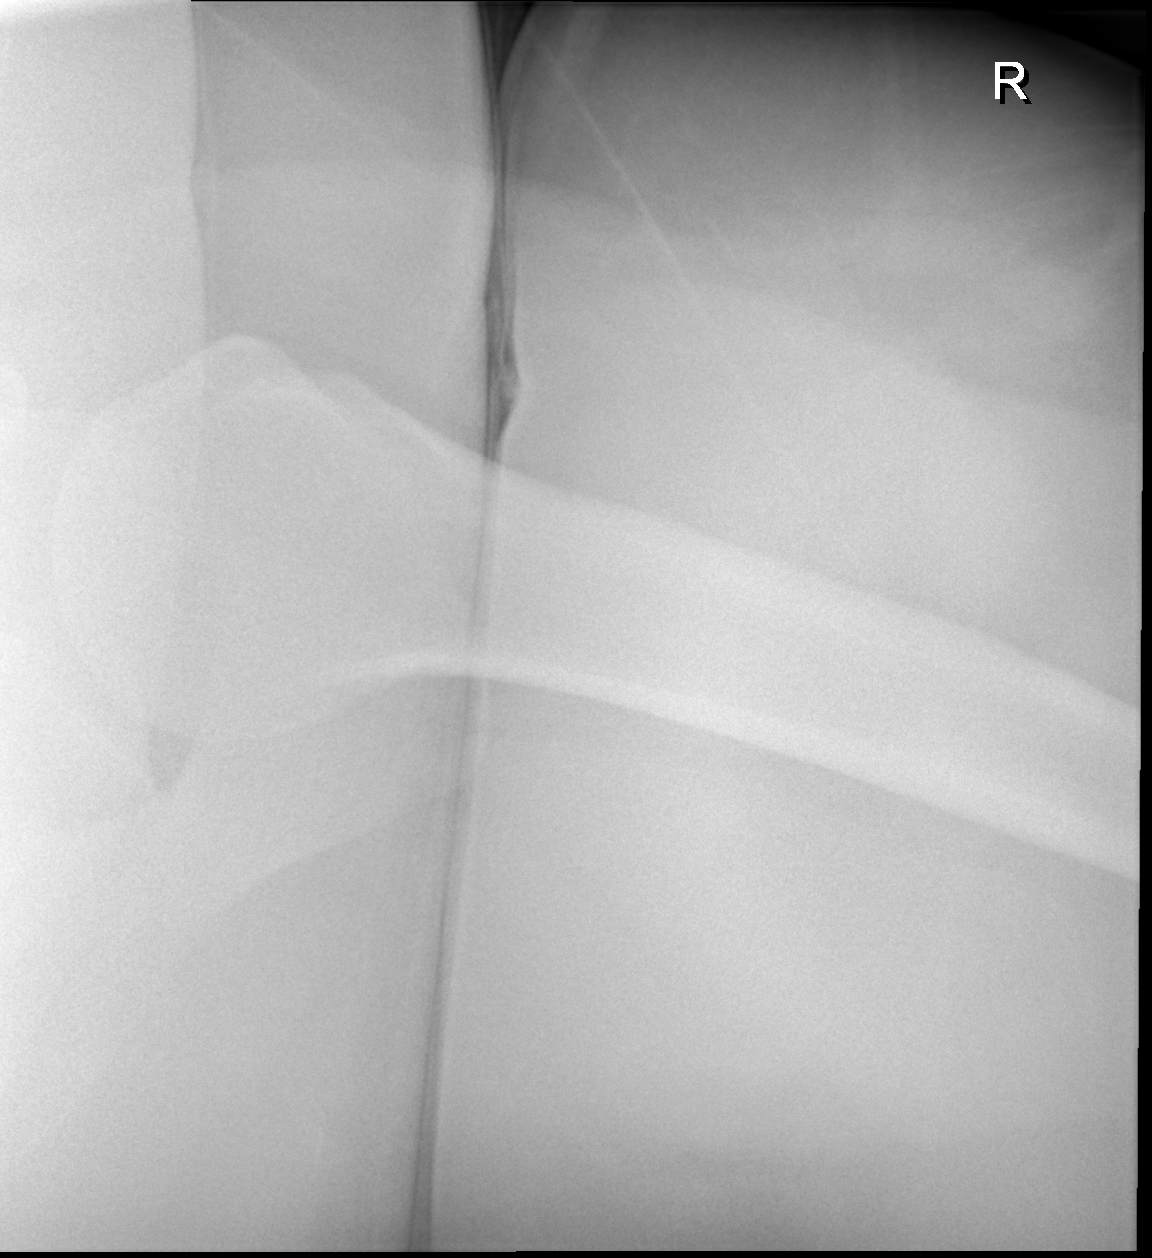

[3 of 3 positions shown; findings below may reference images not displayed]

FINDINGS: There is no evidence of fracture or dislocation. There is no
evidence of arthropathy or other focal bone abnormality. Soft
tissues are unremarkable.
IMPRESSION: Negative.

## 2021-01-11 ENCOUNTER — Emergency Department (HOSPITAL_BASED_OUTPATIENT_CLINIC_OR_DEPARTMENT_OTHER)
Admission: EM | Admit: 2021-01-11 | Discharge: 2021-01-11 | Disposition: A | Discharge status: Home / Self Care | Payer: BC Managed Care – PPO | Attending: Emergency Medicine | Admitting: Emergency Medicine

## 2021-01-11 ENCOUNTER — Other Ambulatory Visit: Payer: Self-pay

## 2021-01-11 ENCOUNTER — Encounter (HOSPITAL_BASED_OUTPATIENT_CLINIC_OR_DEPARTMENT_OTHER): Payer: Self-pay | Admitting: *Deleted

## 2021-01-11 DIAGNOSIS — H1012 Acute atopic conjunctivitis, left eye: Secondary | ICD-10-CM | POA: Diagnosis not present

## 2021-01-11 DIAGNOSIS — H5789 Other specified disorders of eye and adnexa: Secondary | ICD-10-CM | POA: Diagnosis present

## 2021-01-11 MED ORDER — TETRACAINE HCL 0.5 % OP SOLN
1.0000 [drp] | Freq: Once | OPHTHALMIC | Status: AC
  Administered 2021-01-11: 1 [drp] via OPHTHALMIC
  Filled 2021-01-11: qty 4

## 2021-01-11 MED ORDER — FLUORESCEIN SODIUM 1 MG OP STRP
1.0000 | ORAL_STRIP | Freq: Once | OPHTHALMIC | Status: AC
  Administered 2021-01-11: 1 via OPHTHALMIC
  Filled 2021-01-11: qty 1

## 2021-01-11 MED ORDER — NAPHAZOLINE-PHENIRAMINE 0.025-0.3 % OP SOLN: 1.0000 [drp] | Freq: Four times a day (QID) | OPHTHALMIC | 0 refills | Status: AC | PRN

## 2021-01-11 NOTE — ED Notes (Signed)
2 days ago began having redness in left eye, had light sensitivity, been using a cool rag which has provided relief. Had some burning sensation intermittently. Denies any vision issues, states "I can see fine" , left eye watery, tearing up at times as well.

## 2021-01-11 NOTE — ED Triage Notes (Signed)
Left eye irritation x 2 days.

## 2021-01-11 NOTE — Discharge Instructions (Signed)
Use eyedrops as directed. Follow-up with eye specialist listed below. Return to the ER if you start to experience pain with moving your eye, swelling around the eye, fever, pus draining from the eye

## 2021-01-11 NOTE — ED Provider Notes (Signed)
[facility] EMERGENCY DEPARTMENT Provider Note   CSN: [id number] Arrival date & time: 01/11/21  1239     History Chief Complaint  Patient presents with  . Eye Problem    Teresa Parsons is a 20 y.o. female who presents to ED with 2-day history of left eye irritation.  States that she woke up with irritation and clear drainage from her left eye.  States that she has been applying a cool compress on it which has significantly improved the symptoms.  States that she wanted to "still be checked out."  She does not wear contact or corrective lenses and denies any vision changes.  No foreign body sensation or trauma to the area.  Denies any pain with EOMs, proptosis, purulent drainage or fever.  HPI     History reviewed. No pertinent past medical history.  There are no problems to display for this patient.   History reviewed. No pertinent surgical history.   OB History   No obstetric history on file.     Family History  Problem Relation Age of Onset  . Diabetes Mother     Social History   Tobacco Use  . Smoking status: Never Smoker  . Smokeless tobacco: Never Used  Vaping Use  . Vaping Use: Never used  Substance Use Topics  . Alcohol use: Yes    Comment: socially  . Drug use: Yes    Types: Marijuana    Comment: august 2021    Home Medications Prior to Admission medications   Medication Sig Start Date End Date Taking? Authorizing Provider  naphazoline-pheniramine (NAPHCON-A) 0.025-0.3 % ophthalmic solution Place 1 drop into the left eye 4 (four) times daily as needed for eye irritation. 01/11/21  Yes Edgard Debord, PA-C  methocarbamol (ROBAXIN) 500 MG tablet Take 1 tablet (500 mg total) by mouth every 8 (eight) hours as needed. 12/11/19   Petrucelli, Samantha R, PA-C  naproxen (NAPROSYN) 500 MG tablet Take 1 tablet (500 mg total) by mouth 2 (two) times daily. 12/11/19   Petrucelli, Pleas Koch, PA-C    Allergies    Patient has no known allergies.  Review  of Systems   Review of Systems  Constitutional: Negative for fever.  HENT: Negative for facial swelling.   Eyes: Positive for discharge, redness and itching. Negative for photophobia, pain and visual disturbance.    Physical Exam Updated Vital Signs BP (!) 142/94 (BP Location: Right Arm)   Pulse 98   Temp 98.6 F (37 C) (Oral)   Resp 18   Ht 5\' 5"  (1.651 m)   LMP 12/11/2020   SpO2 99%   BMI 61.74 kg/m   Physical Exam Vitals and nursing note reviewed.  Constitutional:      General: She is not in acute distress.    Appearance: She is well-developed and well-nourished. She is not diaphoretic.  HENT:     Head: Normocephalic and atraumatic.  Eyes:     General: No scleral icterus.       Right eye: No foreign body or discharge.        Left eye: Discharge (Clear) present.No foreign body.     Extraocular Movements: EOM normal.     Conjunctiva/sclera: Conjunctivae normal.     Right eye: No exudate.    Left eye: No exudate.    Pupils: Pupils are equal, round, and reactive to light.     Comments: Left eye with injected conjunctiva, no eyelid swelling or erythema or tenderness to palpation.  Mild clear tearful  drainage noted.  No foreign bodies noted.  No pain with EOMs.  No chemosis, proptosis, or consensual photophobia. Fluorescein stain with no corneal abrasions, foreign bodies, dendritic lesions, ulcerations, negative Sidel sign.  Pulmonary:     Effort: Pulmonary effort is normal. No respiratory distress.  Musculoskeletal:     Cervical back: Normal range of motion.  Skin:    Findings: No rash.  Neurological:     Mental Status: She is alert.  Psychiatric:        Mood and Affect: Mood and affect normal.     ED Results / Procedures / Treatments   Labs (all labs ordered are listed, but only abnormal results are displayed) Labs Reviewed - No data to display  EKG None  Radiology No results found.  Procedures Procedures (including critical care time)  Medications  Ordered in ED Medications  fluorescein ophthalmic strip 1 strip (1 strip Left Eye Given 01/11/21 1604)  tetracaine (PONTOCAINE) 0.5 % ophthalmic solution 1 drop (1 drop Left Eye Given 01/11/21 1603)    ED Course  I have reviewed the triage vital signs and the nursing notes.  Pertinent labs & imaging results that were available during my care of the patient were reviewed by me and considered in my medical decision making (see chart for details).    MDM Rules/Calculators/A&P                          20 year old female presenting to the ED for left eye irritation, redness and clear drainage.  Symptoms began 2 days ago but have significantly improved with cool compresses and over-the-counter eyedrops.  Denies any blurry vision or changes to vision.  Visual examination by me of the left eye with some clear drainage but otherwise unremarkable.  EOMs are intact, no pain with EOMs, proptosis noted.  Fluorescein exam without any corneal abrasions or other abnormalities.  Suspect allergic conjunctivitis.  Will treat with Naphcon-A drops and ophthalmology follow-up.  No evidence of orbital cellulitis, corneal abrasion, or infectious conjunctivitis or other emergent cause.  Patient is agreeable to the plan.  Return precautions given.  Patient is hemodynamically stable, in NAD, and able to ambulate in the ED. Evaluation does not show pathology that would require ongoing emergent intervention or inpatient treatment. I explained the diagnosis to the patient. Pain has been managed and has no complaints prior to discharge. Patient is comfortable with above plan and is stable for discharge at this time. All questions were answered prior to disposition. Strict return precautions for returning to the ED were discussed. Encouraged follow up with PCP.   An After Visit Summary was printed and given to the patient.   Portions of this note were generated with Scientist, clinical (histocompatibility and immunogenetics). Dictation errors may occur despite  best attempts at proofreading.  Final Clinical Impression(s) / ED Diagnoses Final diagnoses:  Allergic conjunctivitis of left eye    Rx / DC Orders ED Discharge Orders         Ordered    naphazoline-pheniramine (NAPHCON-A) 0.025-0.3 % ophthalmic solution  4 times daily PRN        01/11/21 1613           Dietrich Pates, PA-C 01/11/21 1616    Horton, Clabe Seal, DO 01/12/21 0008
